# Patient Record
Sex: Male | Born: 1980 | Race: Black or African American | Hispanic: No | Marital: Single | Smoking: Current every day smoker
Health system: Southern US, Community
[De-identification: ages and names within clinical notes are randomized; demographics above are authoritative.]

## PROBLEM LIST (undated history)

## (undated) DIAGNOSIS — J45909 Unspecified asthma, uncomplicated: Secondary | ICD-10-CM

---

## 2013-10-11 ENCOUNTER — Emergency Department (HOSPITAL_COMMUNITY)
Admission: EM | Admit: 2013-10-11 | Discharge: 2013-10-11 | Disposition: A | Discharge status: Home / Self Care | Payer: Self-pay | Attending: Emergency Medicine | Admitting: Emergency Medicine

## 2013-10-11 ENCOUNTER — Encounter (HOSPITAL_COMMUNITY): Payer: Self-pay | Admitting: Emergency Medicine

## 2013-10-11 ENCOUNTER — Emergency Department (HOSPITAL_COMMUNITY): Payer: Self-pay

## 2013-10-11 DIAGNOSIS — L03213 Periorbital cellulitis: Secondary | ICD-10-CM

## 2013-10-11 DIAGNOSIS — R51 Headache: Secondary | ICD-10-CM | POA: Insufficient documentation

## 2013-10-11 DIAGNOSIS — F17200 Nicotine dependence, unspecified, uncomplicated: Secondary | ICD-10-CM | POA: Insufficient documentation

## 2013-10-11 DIAGNOSIS — J45909 Unspecified asthma, uncomplicated: Secondary | ICD-10-CM | POA: Insufficient documentation

## 2013-10-11 DIAGNOSIS — L03211 Cellulitis of face: Principal | ICD-10-CM | POA: Insufficient documentation

## 2013-10-11 DIAGNOSIS — L0201 Cutaneous abscess of face: Secondary | ICD-10-CM | POA: Insufficient documentation

## 2013-10-11 HISTORY — DX: Unspecified asthma, uncomplicated: J45.909

## 2013-10-11 LAB — CBC WITH DIFFERENTIAL/PLATELET
BASOS ABS: 0 10*3/uL (ref 0.0–0.1)
Basophils Relative: 0 % (ref 0–1)
Eosinophils Absolute: 0.1 10*3/uL (ref 0.0–0.7)
Eosinophils Relative: 1 % (ref 0–5)
HEMATOCRIT: 45 % (ref 39.0–52.0)
Hemoglobin: 16.1 g/dL (ref 13.0–17.0)
Lymphocytes Relative: 24 % (ref 12–46)
Lymphs Abs: 2.3 10*3/uL (ref 0.7–4.0)
MCH: 32.4 pg (ref 26.0–34.0)
MCHC: 35.8 g/dL (ref 30.0–36.0)
MCV: 90.5 fL (ref 78.0–100.0)
Monocytes Absolute: 1 10*3/uL (ref 0.1–1.0)
Monocytes Relative: 10 % (ref 3–12)
NEUTROS ABS: 6.2 10*3/uL (ref 1.7–7.7)
Neutrophils Relative %: 65 % (ref 43–77)
Platelets: 291 10*3/uL (ref 150–400)
RBC: 4.97 MIL/uL (ref 4.22–5.81)
RDW: 12.9 % (ref 11.5–15.5)
WBC: 9.6 10*3/uL (ref 4.0–10.5)

## 2013-10-11 LAB — POCT I-STAT, CHEM 8
BUN: 8 mg/dL (ref 6–23)
CHLORIDE: 101 meq/L (ref 96–112)
Calcium, Ion: 1.21 mmol/L (ref 1.12–1.23)
Creatinine, Ser: 0.9 mg/dL (ref 0.50–1.35)
Glucose, Bld: 91 mg/dL (ref 70–99)
HCT: 46 % (ref 39.0–52.0)
HEMOGLOBIN: 15.6 g/dL (ref 13.0–17.0)
Potassium: 4 mEq/L (ref 3.7–5.3)
Sodium: 141 mEq/L (ref 137–147)
TCO2: 28 mmol/L (ref 0–100)

## 2013-10-11 MED ORDER — CLINDAMYCIN HCL 150 MG PO CAPS: 300.0000 mg | ORAL_CAPSULE | Freq: Three times a day (TID) | ORAL | Status: AC

## 2013-10-11 MED ORDER — IOHEXOL 300 MG/ML  SOLN
80.0000 mL | Freq: Once | INTRAMUSCULAR | Status: AC | PRN
  Administered 2013-10-11: 80 mL via INTRAVENOUS

## 2013-10-11 NOTE — ED Notes (Signed)
Rob PA-C at bedside- instructing patient on dx and need to come back if worsening, and for re-check in 2 days.

## 2013-10-11 NOTE — ED Provider Notes (Signed)
CSN: [id number]     Arrival date & time 10/11/13  0944 History  This chart was scribed for non-physician practitioner, Roxy Horseman, PA-C working with Derwood Kaplan, MD by Greggory Stallion, ED scribe. This patient was seen in room TR05C/TR05C and the patient's care was started at 10:18 AM.   Chief Complaint  Patient presents with  . Facial Swelling   The history is provided by the patient. No language interpreter was used.   HPI Comments: Frank Reynolds is a 33 y.o. male who presents to the Emergency Department complaining of gradual onset, constant facial swelling with associated throbbing pain that started one week ago. Pt states it started on the right and was relieved by ice. He states it has now moved under his left eye. Pt has developed headache and chills over the last 3 days. Leaving back can relieve some pain. He also has rhinorrhea. Denies fever, nausea, emesis.   Past Medical History  Diagnosis Date  . Asthma    History reviewed. No pertinent past surgical history. No family history on file. History  Substance Use Topics  . Smoking status: Current Every Day Smoker  . Smokeless tobacco: Not on file  . Alcohol Use: Yes    Review of Systems  Constitutional: Positive for chills. Negative for fever.  HENT: Positive for facial swelling and rhinorrhea.   Gastrointestinal: Negative for nausea and vomiting.  Neurological: Positive for headaches.    Allergies  Review of patient's allergies indicates no known allergies.  Home Medications  No current outpatient prescriptions on file.  BP 137/80  Pulse 80  Temp(Src) 98.6 F (37 C)  Resp 16  SpO2 99%  Physical Exam  Nursing note and vitals reviewed. Constitutional: He is oriented to person, place, and time. He appears well-developed. No distress.  HENT:  Head: Normocephalic and atraumatic.  Moderate swelling to the nose and inferior periorbital regions, moderately tender to palpation, nares are patent, no abscess  Eyes:  Conjunctivae and EOM are normal.  No pain with eye movement. No evidence of entrapment.   Cardiovascular: Normal rate and regular rhythm.   Pulmonary/Chest: Effort normal. No stridor. No respiratory distress.  Abdominal: He exhibits no distension.  Musculoskeletal: He exhibits no edema.  Neurological: He is alert and oriented to person, place, and time.  Skin: Skin is warm and dry.  Psychiatric: He has a normal mood and affect.    ED Course  Procedures (including critical care time)  DIAGNOSTIC STUDIES: Oxygen Saturation is 99% on RA, normal by my interpretation.    COORDINATION OF CARE: 10:22 AM-Discussed treatment plan which includes head CT with pt at bedside and pt agreed to plan. Advised pt that if the CT is negative, he will be discharged home with an antibiotic.   Results for orders placed during the hospital encounter of 10/11/13  CBC WITH DIFFERENTIAL      Result Value Range   WBC 9.6  4.0 - 10.5 K/uL   RBC 4.97  4.22 - 5.81 MIL/uL   Hemoglobin 16.1  13.0 - 17.0 g/dL   HCT 78.2  95.6 - 21.3 %   MCV 90.5  78.0 - 100.0 fL   MCH 32.4  26.0 - 34.0 pg   MCHC 35.8  30.0 - 36.0 g/dL   RDW 08.6  57.8 - 46.9 %   Platelets 291  150 - 400 K/uL   Neutrophils Relative % 65  43 - 77 %   Neutro Abs 6.2  1.7 - 7.7 K/uL  Lymphocytes Relative 24  12 - 46 %   Lymphs Abs 2.3  0.7 - 4.0 K/uL   Monocytes Relative 10  3 - 12 %   Monocytes Absolute 1.0  0.1 - 1.0 K/uL   Eosinophils Relative 1  0 - 5 %   Eosinophils Absolute 0.1  0.0 - 0.7 K/uL   Basophils Relative 0  0 - 1 %   Basophils Absolute 0.0  0.0 - 0.1 K/uL  POCT I-STAT, CHEM 8      Result Value Range   Sodium 141  137 - 147 mEq/L   Potassium 4.0  3.7 - 5.3 mEq/L   Chloride 101  96 - 112 mEq/L   BUN 8  6 - 23 mg/dL   Creatinine, Ser 8.290.90  0.50 - 1.35 mg/dL   Glucose, Bld 91  70 - 99 mg/dL   Calcium, Ion 5.621.21  1.301.12 - 1.23 mmol/L   TCO2 28  0 - 100 mmol/L   Hemoglobin 15.6  13.0 - 17.0 g/dL   HCT 86.546.0  78.439.0 - 69.652.0 %    Ct Maxillofacial W/cm  10/11/2013   CLINICAL DATA:  Facial swelling, question periorbital cellulitis.  EXAM: CT MAXILLOFACIAL WITH CONTRAST  TECHNIQUE: Multidetector CT imaging of the maxillofacial structures was performed with intravenous contrast. Multiplanar CT image reconstructions were also generated. A small metallic BB was placed on the right temple in order to reliably differentiate right from left.  CONTRAST:  80mL OMNIPAQUE IOHEXOL 300 MG/ML  SOLN  COMPARISON:  None.  FINDINGS: Mild soft tissue swelling in the subcutaneous cyst soft tissues overlying the orbits and nose. This remains in the preseptal soft tissues. No intraorbital extension. Globes are intact. No focal fluid collection.  Mild mucosal thickening within the paranasal sinuses, particularly the maxillary sinuses and ethmoid air cells. No air-fluid levels. Mastoids are clear.  No acute bony abnormality.  IMPRESSION: Mild stranding and soft tissue swelling in the preseptal subcutaneous soft tissues compatible with preseptal cellulitis. No intraorbital extension. No focal fluid collection.   Electronically Signed   By: Charlett NoseKevin  Dover M.D.   On: 10/11/2013 12:31     EKG Interpretation   None       MDM   1. Preseptal cellulitis    Patient with preseptal cellulitis, no evidence of periorbital cellulitis on CT scan. Will treat with clindamycin. Recommend recheck in 2 days. Return precautions are given. Patient given clear instructions to return if this worsens. Patient is afebrile, nontoxic-appearing. He is stable and ready for discharge. He understands and agrees with the plan.  I personally performed the services described in this documentation, which was scribed in my presence. The recorded information has been reviewed and is accurate.      Roxy Horsemanobert Konner Saiz, PA-C 10/11/13 1247

## 2013-10-11 NOTE — Discharge Instructions (Signed)
Return for worsening symptoms.  Return in 2 days for re-check.  Cellulitis Cellulitis is an infection of the skin and the tissue beneath it. The infected area is usually red and tender. Cellulitis occurs most often in the arms and lower legs.  CAUSES  Cellulitis is caused by bacteria that enter the skin through cracks or cuts in the skin. The most common types of bacteria that cause cellulitis are Staphylococcus and Streptococcus. SYMPTOMS   Redness and warmth.  Swelling.  Tenderness or pain.  Fever. DIAGNOSIS  Your caregiver can usually determine what is wrong based on a physical exam. Blood tests may also be done. TREATMENT  Treatment usually involves taking an antibiotic medicine. HOME CARE INSTRUCTIONS   Take your antibiotics as directed. Finish them even if you start to feel better.  Keep the infected arm or leg elevated to reduce swelling.  Apply a warm cloth to the affected area up to 4 times per day to relieve pain.  Only take over-the-counter or prescription medicines for pain, discomfort, or fever as directed by your caregiver.  Keep all follow-up appointments as directed by your caregiver. SEEK MEDICAL CARE IF:   You notice red streaks coming from the infected area.  Your red area gets larger or turns dark in color.  Your bone or joint underneath the infected area becomes painful after the skin has healed.  Your infection returns in the same area or another area.  You notice a swollen bump in the infected area.  You develop new symptoms. SEEK IMMEDIATE MEDICAL CARE IF:   You have a fever.  You feel very sleepy.  You develop vomiting or diarrhea.  You have a general ill feeling (malaise) with muscle aches and pains. MAKE SURE YOU:   Understand these instructions.  Will watch your condition.  Will get help right away if you are not doing well or get worse. Document Released: 06/08/2005 Document Revised: 02/28/2012 Document Reviewed:  11/14/2011 [facility] Patient Information 2014 [employer], [place].

## 2013-10-11 NOTE — ED Notes (Signed)
Started 1 week ago with right facial swelling. Has been placing cool/warm compresses on it. Over the past week the swelling has increased and now involves nose and left face. Denies tingling in lips or tongue.

## 2013-10-11 NOTE — ED Notes (Addendum)
Facial swelling x 1 week  First rt side now left no diff swallowing or breathing

## 2013-10-12 NOTE — ED Provider Notes (Signed)
Medical screening examination/treatment/procedure(s) were performed by non-physician practitioner and as supervising physician I was immediately available for consultation/collaboration.  EKG Interpretation   None        Derwood KaplanAnkit Deontae Robson, MD 10/12/13 [id number]

## 2015-08-26 IMAGING — CT CT MAXILLOFACIAL W/ CM
3 series · 16 of 47 positions shown, 19 images · IV contrast (omnipaque)
Comparison: None.

CLINICAL DATA: Facial swelling, question periorbital cellulitis.

EXAM:
CT MAXILLOFACIAL WITH CONTRAST
TECHNIQUE: Multidetector CT imaging of the maxillofacial structures was
performed with intravenous contrast. Multiplanar CT image
reconstructions were also generated. A small metallic BB was placed
on the right temple in order to reliably differentiate right from
left.
CONTRAST:  80mL OMNIPAQUE IOHEXOL 300 MG/ML  SOLN

[Series 3: facial/ orbits 2.0 h30s · axial · 0.40mm/px · z∈[-204,-34]mm · 10 of 99 slices shown, 13 images]
[im 7/99  brain]
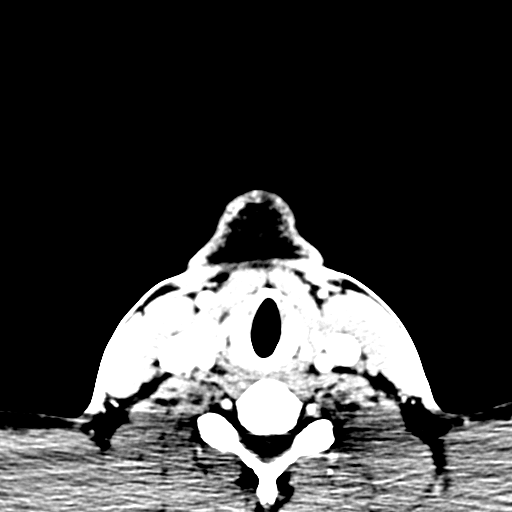
[im 7/99  bone]
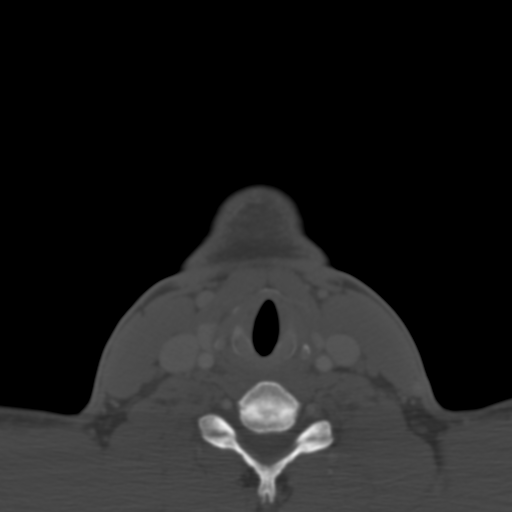
[im 17/99  bone]
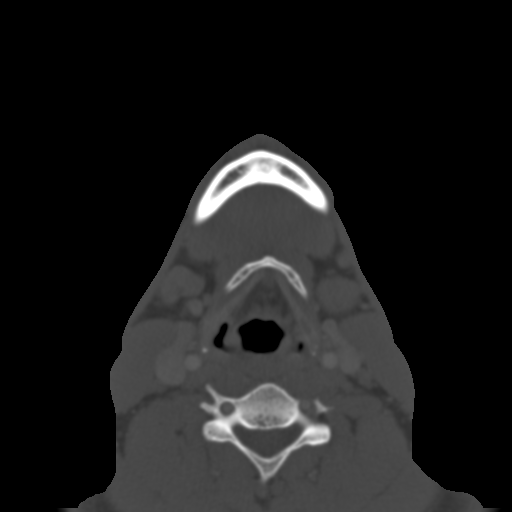
[im 28/99  bone]
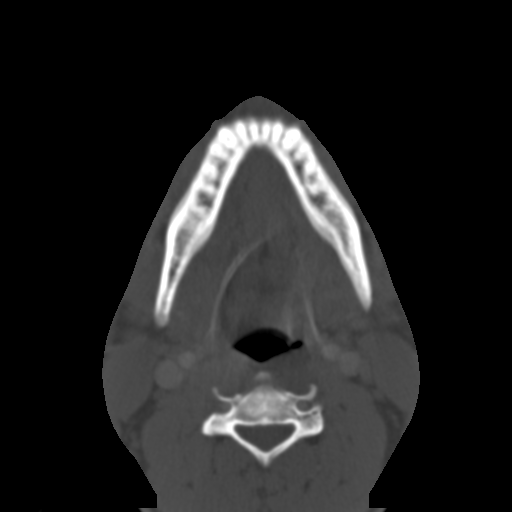
[im 34/99  bone]
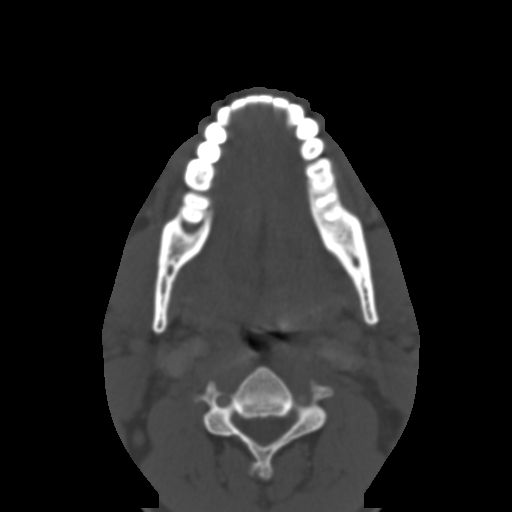
[im 44/99  brain]
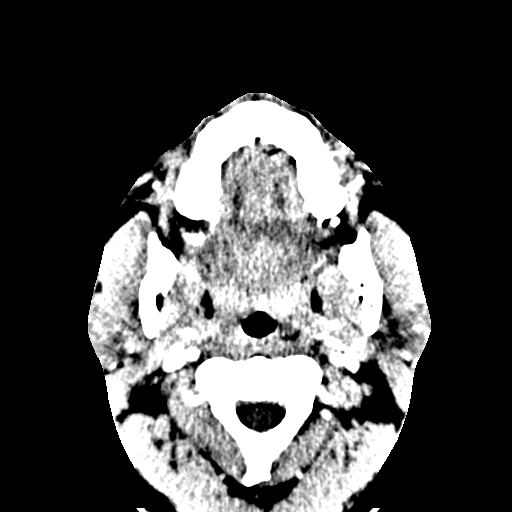
[im 44/99  bone]
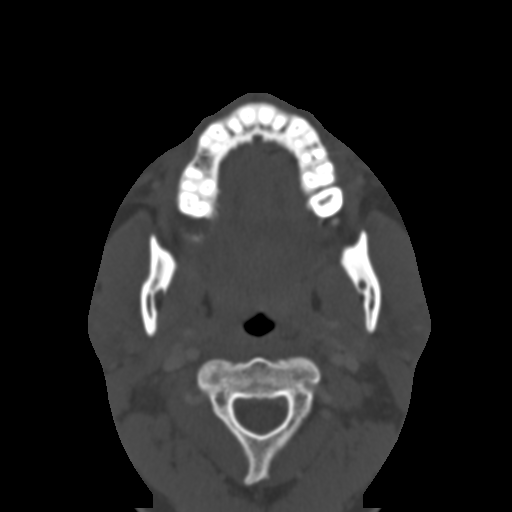
[im 55/99  bone]
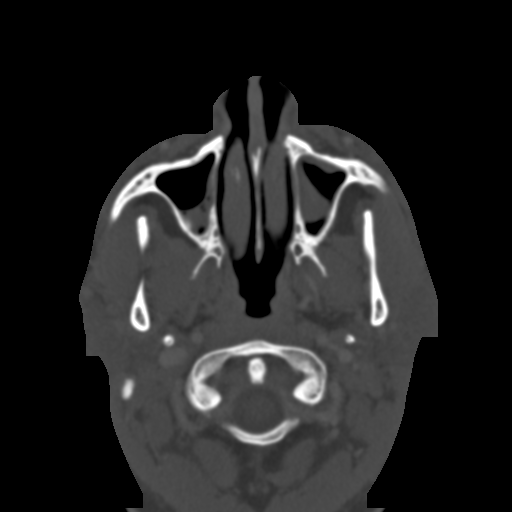
[im 65/99  bone]
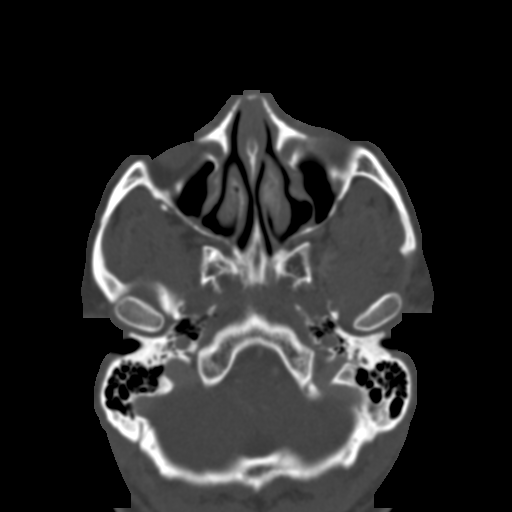
[im 75/99  bone]
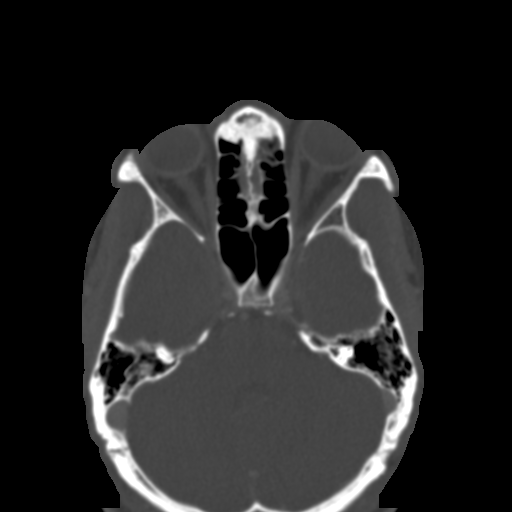
[im 82/99  brain]
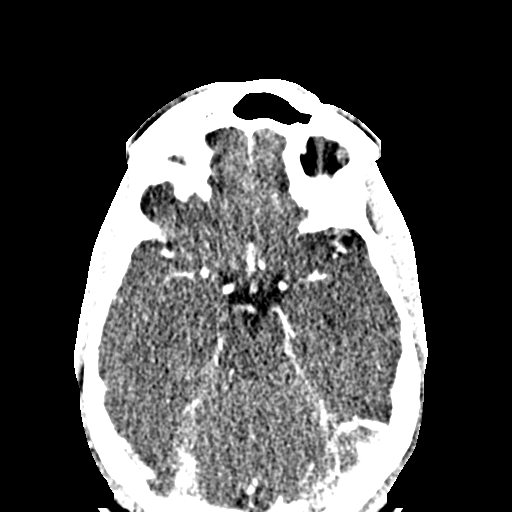
[im 82/99  bone]
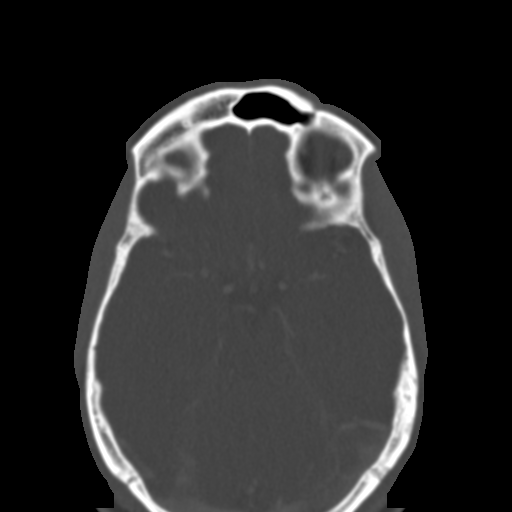
[im 92/99  bone]
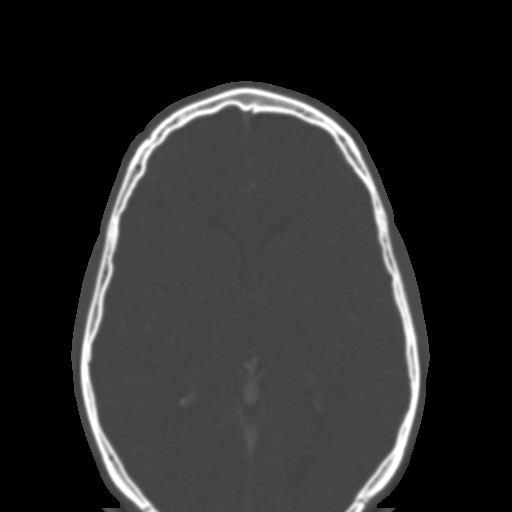

[Series 5: coronal st · coronal · 0.38mm/px · 3 of 93 slices shown]
[im 31/93  bone]
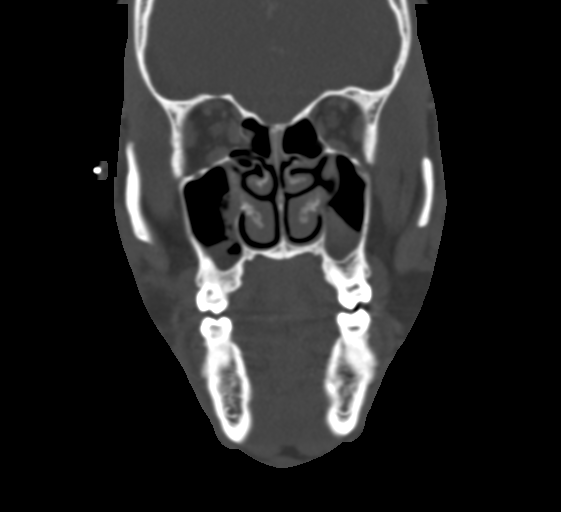
[im 41/93  bone]
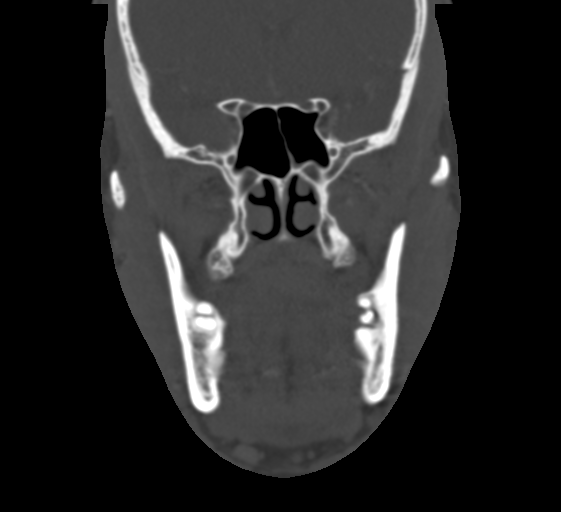
[im 52/93  bone]
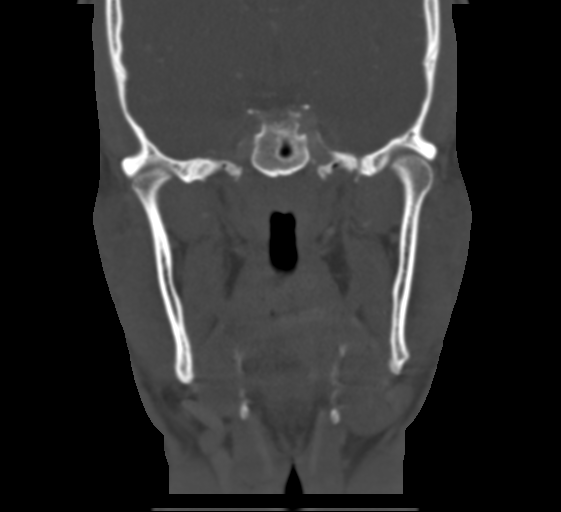

[Series 6: sagittal st · sagittal · 0.41mm/px · 3 of 84 slices shown]
[im 28/84  bone]
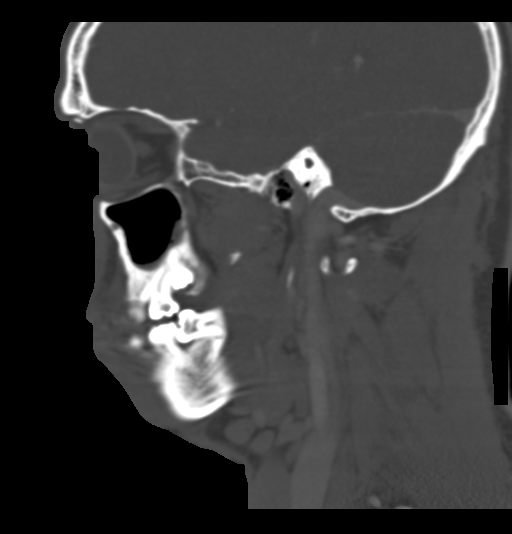
[im 42/84  bone]
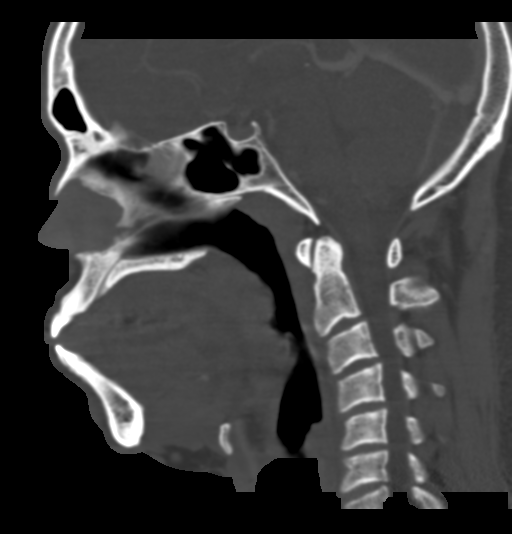
[im 56/84  bone]
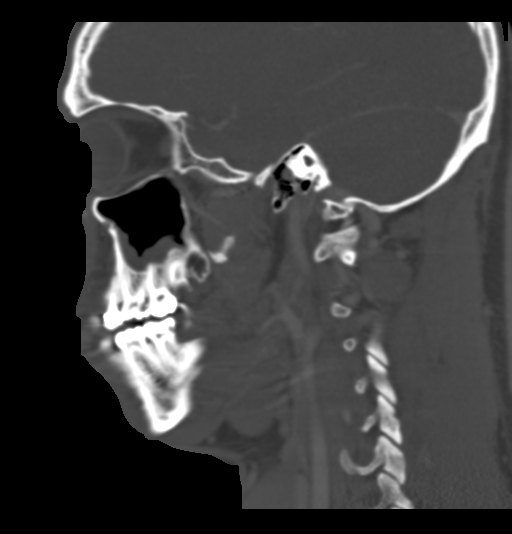

[16 of 47 positions shown; findings below may reference images not displayed]

FINDINGS: Mild soft tissue swelling in the subcutaneous cyst soft tissues
overlying the orbits and nose. This remains in the preseptal soft
tissues. No intraorbital extension. Globes are intact. No focal
fluid collection.

Mild mucosal thickening within the paranasal sinuses, particularly
the maxillary sinuses and ethmoid air cells. No air-fluid levels.
Mastoids are clear.

No acute bony abnormality.
IMPRESSION: Mild stranding and soft tissue swelling in the preseptal
subcutaneous soft tissues compatible with preseptal cellulitis. No
intraorbital extension. No focal fluid collection.

## 2019-07-12 ENCOUNTER — Other Ambulatory Visit: Payer: Self-pay

## 2019-07-12 DIAGNOSIS — Z20822 Contact with and (suspected) exposure to covid-19: Secondary | ICD-10-CM

## 2019-07-14 LAB — NOVEL CORONAVIRUS, NAA: SARS-CoV-2, NAA: NOT DETECTED
